# Patient Record
Sex: Female | Born: 1960 | Race: Black or African American | Hispanic: No | Marital: Single | Smoking: Never smoker
Health system: Southern US, Community
[De-identification: ages and names within clinical notes are randomized; demographics above are authoritative.]

## PROBLEM LIST (undated history)

## (undated) DIAGNOSIS — M810 Age-related osteoporosis without current pathological fracture: Secondary | ICD-10-CM

## (undated) DIAGNOSIS — N809 Endometriosis, unspecified: Secondary | ICD-10-CM

## (undated) DIAGNOSIS — D72819 Decreased white blood cell count, unspecified: Secondary | ICD-10-CM

## (undated) DIAGNOSIS — D649 Anemia, unspecified: Principal | ICD-10-CM

## (undated) HISTORY — PX: ABDOMINAL SURGERY: SHX537

## (undated) HISTORY — DX: Anemia, unspecified: D64.9

## (undated) HISTORY — DX: Decreased white blood cell count, unspecified: D72.819

---

## 1999-01-20 ENCOUNTER — Encounter: Admission: RE | Admit: 1999-01-20 | Discharge: 1999-01-20 | Payer: Self-pay | Admitting: General Practice

## 1999-01-20 ENCOUNTER — Encounter: Payer: Self-pay | Admitting: General Practice

## 1999-07-17 ENCOUNTER — Other Ambulatory Visit: Admission: RE | Admit: 1999-07-17 | Discharge: 1999-07-17 | Payer: Self-pay | Admitting: Obstetrics and Gynecology

## 2002-01-26 ENCOUNTER — Ambulatory Visit (HOSPITAL_COMMUNITY): Admission: RE | Admit: 2002-01-26 | Discharge: 2002-01-26 | Payer: Self-pay | Admitting: Internal Medicine

## 2002-01-26 ENCOUNTER — Encounter: Payer: Self-pay | Admitting: Internal Medicine

## 2003-01-30 ENCOUNTER — Ambulatory Visit (HOSPITAL_COMMUNITY): Admission: RE | Admit: 2003-01-30 | Discharge: 2003-01-30 | Payer: Self-pay | Admitting: Internal Medicine

## 2005-10-13 ENCOUNTER — Other Ambulatory Visit: Admission: RE | Admit: 2005-10-13 | Discharge: 2005-10-13 | Payer: Self-pay | Admitting: Obstetrics and Gynecology

## 2005-10-20 ENCOUNTER — Encounter: Admission: RE | Admit: 2005-10-20 | Discharge: 2005-10-20 | Payer: Self-pay | Admitting: Obstetrics and Gynecology

## 2005-12-26 ENCOUNTER — Ambulatory Visit: Payer: Self-pay | Admitting: Hematology & Oncology

## 2006-01-01 LAB — CBC WITH DIFFERENTIAL/PLATELET
BASO%: 0.4 % (ref 0.0–2.0)
EOS%: 1.5 % (ref 0.0–7.0)
MCHC: 34.2 g/dL (ref 32.0–36.0)
MONO#: 0.2 10*3/uL (ref 0.1–0.9)
RBC: 3.87 10*6/uL (ref 3.70–5.32)
RDW: 13.4 % (ref 11.3–14.5)
WBC: 2.4 10*3/uL — ABNORMAL LOW (ref 3.9–10.0)
lymph#: 0.9 10*3/uL (ref 0.9–3.3)

## 2006-01-01 LAB — CHCC SMEAR

## 2006-01-06 LAB — PROTEIN ELECTROPHORESIS, SERUM
Albumin ELP: 59.3 % (ref 55.8–66.1)
Total Protein, Serum Electrophoresis: 8.2 g/dL (ref 6.0–8.3)

## 2006-01-06 LAB — RHEUMATOID FACTOR: Rheumatoid fact SerPl-aCnc: <20 IU/mL (ref 0–20)

## 2006-01-06 LAB — VITAMIN B12: Vitamin B-12: 658 pg/mL (ref 211–911)

## 2006-01-06 LAB — FERRITIN: Ferritin: 111 ng/mL (ref 10–291)

## 2006-02-22 ENCOUNTER — Ambulatory Visit: Payer: Self-pay | Admitting: Hematology & Oncology

## 2006-04-19 ENCOUNTER — Ambulatory Visit: Payer: Self-pay | Admitting: Hematology & Oncology

## 2006-11-10 ENCOUNTER — Encounter: Admission: RE | Admit: 2006-11-10 | Discharge: 2006-11-10 | Payer: Self-pay | Admitting: Obstetrics and Gynecology

## 2007-11-14 ENCOUNTER — Encounter: Admission: RE | Admit: 2007-11-14 | Discharge: 2007-11-14 | Payer: Self-pay | Admitting: Obstetrics and Gynecology

## 2010-12-05 ENCOUNTER — Other Ambulatory Visit (HOSPITAL_COMMUNITY): Payer: Self-pay | Admitting: Obstetrics and Gynecology

## 2010-12-05 DIAGNOSIS — Z1231 Encounter for screening mammogram for malignant neoplasm of breast: Secondary | ICD-10-CM

## 2010-12-16 ENCOUNTER — Ambulatory Visit (HOSPITAL_COMMUNITY)
Admission: RE | Admit: 2010-12-16 | Discharge: 2010-12-16 | Disposition: A | Discharge status: Home / Self Care | Payer: 59 | Source: Ambulatory Visit | Attending: Obstetrics and Gynecology | Admitting: Obstetrics and Gynecology

## 2010-12-16 DIAGNOSIS — Z1231 Encounter for screening mammogram for malignant neoplasm of breast: Secondary | ICD-10-CM | POA: Insufficient documentation

## 2011-08-11 ENCOUNTER — Emergency Department (HOSPITAL_COMMUNITY)
Admission: EM | Admit: 2011-08-11 | Discharge: 2011-08-12 | Disposition: A | Discharge status: Home / Self Care | Payer: No Typology Code available for payment source | Attending: Emergency Medicine | Admitting: Emergency Medicine

## 2011-08-11 ENCOUNTER — Encounter (HOSPITAL_COMMUNITY): Payer: Self-pay | Admitting: Emergency Medicine

## 2011-08-11 ENCOUNTER — Emergency Department (HOSPITAL_COMMUNITY): Payer: No Typology Code available for payment source

## 2011-08-11 DIAGNOSIS — S20219A Contusion of unspecified front wall of thorax, initial encounter: Secondary | ICD-10-CM | POA: Insufficient documentation

## 2011-08-11 DIAGNOSIS — R079 Chest pain, unspecified: Secondary | ICD-10-CM | POA: Insufficient documentation
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
## 2011-08-12 ENCOUNTER — Emergency Department (HOSPITAL_COMMUNITY): Payer: No Typology Code available for payment source

## 2011-08-12 MED ORDER — CYCLOBENZAPRINE HCL 10 MG PO TABS: 10.0000 mg | ORAL_TABLET | Freq: Two times a day (BID) | ORAL | Status: DC | PRN

## 2011-08-12 MED ORDER — IBUPROFEN 800 MG PO TABS
800.0000 mg | ORAL_TABLET | Freq: Once | ORAL | Status: AC
  Administered 2011-08-12: 800 mg via ORAL
  Filled 2011-08-12: qty 1

## 2011-08-12 MED ORDER — IBUPROFEN 800 MG PO TABS: 800.0000 mg | ORAL_TABLET | Freq: Three times a day (TID) | ORAL | Status: AC

## 2011-08-12 NOTE — ED Notes (Signed)
Patient transported to X-ray 
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
## 2011-08-21 ENCOUNTER — Emergency Department (INDEPENDENT_AMBULATORY_CARE_PROVIDER_SITE_OTHER)
Admission: EM | Admit: 2011-08-21 | Discharge: 2011-08-21 | Disposition: A | Discharge status: Home Health | Payer: No Typology Code available for payment source | Source: Home / Self Care | Attending: Emergency Medicine | Admitting: Emergency Medicine

## 2011-08-21 ENCOUNTER — Encounter (HOSPITAL_COMMUNITY): Payer: Self-pay

## 2011-08-21 DIAGNOSIS — S20212S Contusion of left front wall of thorax, sequela: Secondary | ICD-10-CM

## 2011-08-21 HISTORY — DX: Endometriosis, unspecified: N80.9

## 2011-08-21 MED ORDER — CYCLOBENZAPRINE HCL 10 MG PO TABS: 10.0000 mg | ORAL_TABLET | Freq: Two times a day (BID) | ORAL | Status: AC | PRN
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
## 2011-08-21 NOTE — Discharge Instructions (Signed)
Take the medication as written. Take 1 gram of tylenol with the motrin up to 4 times a day as needed for pain and fever. This with the meloxicam is an effective combination for pain. Do not exceed 4 grams of tylenol a day from all sources. Return if you get worse, have a  fever >100.4, or for any concerns.   Go to www.goodrx.com to look up your medications. This will give you a list of where you can find your prescriptions at the most affordable prices.   Go to www.goodrx.com to look up your medications. This will give you a list of where you can find your prescriptions at the most affordable prices.   Call [facility]  [phone]  If you have no primary doctor, here are some resources that may be helpful:  Medicaid-accepting [facility] Providers:   - [facility]- [address]      [phone]      Mon-Fri 9am-7pm, Sat 9am-1pm   - [facility]- [address] [place]      [phone]   - [facility]- [address], [address] [place]      [phone]   [facility]- [address]      [phone]   - Renaye Rakers- [address] [place], [address]      [phone]      Only accepts [place] patients       after they have her name applied to their card   Self Pay (no insurance) in [place]:   - Sickle Cell Patients: Dr Willey Blade, [facility]      [address] [place]      [phone]   - [phone]   - Physician Referral Service- [phone]   - [facility]- [address] [place]      [phone]   [facility] [place]- [address]   - [facility]- see information above      (Speak to [employer] if you do not have insurance)   - [facility]- [address] [place]      [phone]   - [facility] [place]- [address]      [phone]   - [facility]- [address]      [phone]   - Dr Julio Sicks-  [address], [place]      [phone]   - [facility]- [address]      [phone]   - [facility]- [address]      [phone]      Also [address]      [phone]   - [facility]- [address]      [phone]      1st and 3rd Saturday every month, 10am-1pm    Other agencies that provide inexpensive medical care:     [facility]  [phone]    [facility]  [phone]    [facility]  [phone]    [facility]  [phone] [address] [place] [address]    [facility]  [phone]    [facility]  [phone] [facility] [phone]   [address]  [place], [place] [address]  Chronic Pain Problems Contact [facility]  [phone] Patients need to be referred by their primary care doctor.  [facility]  [facility]     [employer]                          [facility] Dept. [address]                        [address]      [address] [place] [address]   [phone] (After Hours)  General Information: Finding a doctor when you do not have health insurance can be tricky. Although you are not limited by an insurance plan, you are of course limited by her finances and how much but he can pay out of pocket.  What are your options if you don't have health insurance?   1) Find a Librarian, academic and Pay Out of Pocket Although you won't have to find out who is covered by your insurance plan, it is a good idea to ask around and get recommendations. You will then need to call the office and see if the doctor you have chosen will accept you as a new patient and what types of options they offer for patients who are self-pay. Some doctors offer discounts or will set up payment plans for their patients who do not  have insurance, but you will need to ask so you aren't surprised when you get to your appointment.  2) Contact Your Local Health Department Not all health departments have doctors that can see patients for sick visits, but many do, so it is worth a call to see if yours does. If you don't know where your local health department is, you can check in your phone book. The CDC also has a tool to help you locate your state's health department, and many state websites also have listings of all of their local health departments.  3) Find a Walk-in Clinic If your illness is not likely to be very severe or complicated, you may want to try a walk in clinic. These are popping up all over the country in pharmacies, drugstores, and shopping centers. They're usually staffed by nurse practitioners or physician assistants that have been trained to treat common illnesses and complaints. They're usually fairly quick and inexpensive. However, if you have serious medical issues or chronic medical problems, these are probably not your best option

## 2011-11-10 ENCOUNTER — Other Ambulatory Visit (HOSPITAL_COMMUNITY): Payer: Self-pay | Admitting: Family Medicine

## 2011-11-10 DIAGNOSIS — Z1231 Encounter for screening mammogram for malignant neoplasm of breast: Secondary | ICD-10-CM

## 2011-12-23 ENCOUNTER — Ambulatory Visit: Payer: Self-pay | Admitting: Obstetrics and Gynecology

## 2011-12-24 ENCOUNTER — Ambulatory Visit (HOSPITAL_COMMUNITY)
Admission: RE | Admit: 2011-12-24 | Discharge: 2011-12-24 | Disposition: A | Discharge status: Home / Self Care | Payer: No Typology Code available for payment source | Source: Ambulatory Visit | Attending: Family Medicine | Admitting: Family Medicine

## 2011-12-24 DIAGNOSIS — Z1231 Encounter for screening mammogram for malignant neoplasm of breast: Secondary | ICD-10-CM

## 2011-12-30 ENCOUNTER — Other Ambulatory Visit: Payer: Self-pay | Admitting: Family Medicine

## 2011-12-30 DIAGNOSIS — N63 Unspecified lump in unspecified breast: Secondary | ICD-10-CM

## 2012-01-06 ENCOUNTER — Ambulatory Visit
Admission: RE | Admit: 2012-01-06 | Discharge: 2012-01-06 | Disposition: A | Discharge status: Home / Self Care | Payer: 59 | Source: Ambulatory Visit | Attending: Family Medicine | Admitting: Family Medicine

## 2012-01-06 DIAGNOSIS — N63 Unspecified lump in unspecified breast: Secondary | ICD-10-CM

## 2012-04-27 ENCOUNTER — Telehealth: Payer: Self-pay | Admitting: Obstetrics and Gynecology

## 2012-04-27 ENCOUNTER — Encounter (HOSPITAL_COMMUNITY): Payer: Self-pay | Admitting: *Deleted

## 2012-04-27 ENCOUNTER — Emergency Department (HOSPITAL_COMMUNITY)
Admission: EM | Admit: 2012-04-27 | Discharge: 2012-04-27 | Disposition: A | Discharge status: Home / Self Care | Payer: 59 | Source: Home / Self Care | Attending: Family Medicine | Admitting: Family Medicine

## 2012-04-27 DIAGNOSIS — D72819 Decreased white blood cell count, unspecified: Secondary | ICD-10-CM

## 2012-04-27 DIAGNOSIS — R319 Hematuria, unspecified: Secondary | ICD-10-CM

## 2012-04-27 LAB — COMPREHENSIVE METABOLIC PANEL
Albumin: 4.1 g/dL (ref 3.5–5.2)
Alkaline Phosphatase: 51 U/L (ref 39–117)
BUN: 16 mg/dL (ref 6–23)
Calcium: 9.9 mg/dL (ref 8.4–10.5)
Creatinine, Ser: 0.77 mg/dL (ref 0.50–1.10)
GFR calc Af Amer: >90 mL/min (ref >90)
Glucose, Bld: 107 mg/dL — ABNORMAL HIGH (ref 70–99)
Total Protein: 7.6 g/dL (ref 6.0–8.3)

## 2012-04-27 LAB — POCT URINALYSIS DIP (DEVICE)
Glucose, UA: 100 mg/dL — AB
Nitrite: NEGATIVE
Specific Gravity, Urine: 1.025 (ref 1.005–1.030)
Urobilinogen, UA: 2 mg/dL — ABNORMAL HIGH (ref 0.0–1.0)

## 2012-04-27 LAB — CBC
MCHC: 33.6 g/dL (ref 30.0–36.0)
RBC: 3.82 MIL/uL — ABNORMAL LOW (ref 3.87–5.11)
RDW: 13.4 % (ref 11.5–15.5)
WBC: 2.7 10*3/uL — ABNORMAL LOW (ref 4.0–10.5)

## 2012-04-27 LAB — POCT I-STAT, CHEM 8
Calcium, Ion: 1.28 mmol/L — ABNORMAL HIGH (ref 1.12–1.23)
Creatinine, Ser: 1 mg/dL (ref 0.50–1.10)
Glucose, Bld: 106 mg/dL — ABNORMAL HIGH (ref 70–99)
HCT: 36 % (ref 36.0–46.0)
Hemoglobin: 12.2 g/dL (ref 12.0–15.0)
Potassium: 4.1 mEq/L (ref 3.5–5.1)

## 2012-04-27 MED ORDER — CEPHALEXIN 500 MG PO CAPS: 500.0000 mg | ORAL_CAPSULE | Freq: Two times a day (BID) | ORAL | Status: DC

## 2012-04-27 NOTE — ED Notes (Signed)
Cal back number for lab issues verified; Rx compliance reviewed
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
## 2012-04-27 NOTE — ED Notes (Signed)
Hematuria - onset this AM - at first had some very slight cramping but now not other symptoms

## 2012-04-27 NOTE — Telephone Encounter (Signed)
Tc from pt. Pt with "blood in urine" since this am. Pt has noticed blood only with urination. Pt having abd discomfort. Informed pt to contact PCP to sched eval to r/o UTI. Pt voices understanding. Told pt to cb if needed to sched office eval.

## 2012-04-28 LAB — URINE CULTURE
Colony Count: 30000
Special Requests: NORMAL

## 2012-07-03 ENCOUNTER — Other Ambulatory Visit: Payer: Self-pay | Admitting: Oncology

## 2012-11-01 ENCOUNTER — Telehealth: Payer: Self-pay | Admitting: Oncology

## 2012-11-01 NOTE — Telephone Encounter (Signed)
LVOM FOR PT TO RETURN CALL IN RE REFERRAL  °

## 2012-11-02 ENCOUNTER — Telehealth: Payer: Self-pay | Admitting: Oncology

## 2012-11-02 NOTE — Telephone Encounter (Signed)
S/W PT IN RE TO NP APPT 09/10 @ 1:30 W/DR. GRANFORTUNA WELCOME PACKET MAILED. DX- NEUTROPENIA  WELCOME PACKET MAILED.

## 2012-11-08 ENCOUNTER — Telehealth: Payer: Self-pay | Admitting: Oncology

## 2012-11-08 NOTE — Telephone Encounter (Signed)
C/D 11/08/12 for appt. 11/30/12 °

## 2012-11-12 ENCOUNTER — Other Ambulatory Visit: Payer: Self-pay | Admitting: Oncology

## 2012-11-12 ENCOUNTER — Encounter: Payer: Self-pay | Admitting: Oncology

## 2012-11-12 DIAGNOSIS — D649 Anemia, unspecified: Secondary | ICD-10-CM

## 2012-11-12 DIAGNOSIS — D72819 Decreased white blood cell count, unspecified: Secondary | ICD-10-CM

## 2012-11-12 HISTORY — DX: Decreased white blood cell count, unspecified: D72.819

## 2012-11-12 HISTORY — DX: Anemia, unspecified: D64.9

## 2012-11-13 ENCOUNTER — Telehealth: Payer: Self-pay | Admitting: Oncology

## 2012-11-13 NOTE — Telephone Encounter (Signed)
Message to Tiffany re new pt lbs per 8/22 pof.

## 2012-11-30 ENCOUNTER — Encounter: Payer: 59 | Admitting: Oncology

## 2012-11-30 ENCOUNTER — Ambulatory Visit: Payer: 59

## 2012-12-06 ENCOUNTER — Other Ambulatory Visit (HOSPITAL_COMMUNITY): Payer: Self-pay | Admitting: Family Medicine

## 2012-12-06 DIAGNOSIS — Z1231 Encounter for screening mammogram for malignant neoplasm of breast: Secondary | ICD-10-CM

## 2013-01-10 ENCOUNTER — Ambulatory Visit (HOSPITAL_COMMUNITY): Payer: 59

## 2013-01-24 ENCOUNTER — Ambulatory Visit (HOSPITAL_COMMUNITY)
Admission: RE | Admit: 2013-01-24 | Discharge: 2013-01-24 | Disposition: A | Discharge status: Home / Self Care | Payer: 59 | Source: Ambulatory Visit | Attending: Family Medicine | Admitting: Family Medicine

## 2013-01-24 DIAGNOSIS — Z1231 Encounter for screening mammogram for malignant neoplasm of breast: Secondary | ICD-10-CM | POA: Insufficient documentation

## 2013-11-13 ENCOUNTER — Other Ambulatory Visit: Payer: Self-pay | Admitting: Obstetrics and Gynecology

## 2013-11-13 ENCOUNTER — Ambulatory Visit
Admission: RE | Admit: 2013-11-13 | Discharge: 2013-11-13 | Disposition: A | Discharge status: Home / Self Care | Payer: 59 | Source: Ambulatory Visit | Attending: Obstetrics and Gynecology | Admitting: Obstetrics and Gynecology

## 2013-11-13 DIAGNOSIS — M810 Age-related osteoporosis without current pathological fracture: Secondary | ICD-10-CM

## 2013-12-26 ENCOUNTER — Other Ambulatory Visit (HOSPITAL_COMMUNITY): Payer: Self-pay | Admitting: Family Medicine

## 2013-12-26 DIAGNOSIS — Z1231 Encounter for screening mammogram for malignant neoplasm of breast: Secondary | ICD-10-CM

## 2014-01-25 ENCOUNTER — Ambulatory Visit (HOSPITAL_COMMUNITY)
Admission: RE | Admit: 2014-01-25 | Discharge: 2014-01-25 | Disposition: A | Discharge status: Home / Self Care | Payer: 59 | Source: Ambulatory Visit | Attending: Family Medicine | Admitting: Family Medicine

## 2014-01-25 DIAGNOSIS — Z1231 Encounter for screening mammogram for malignant neoplasm of breast: Secondary | ICD-10-CM | POA: Diagnosis not present

## 2015-01-07 ENCOUNTER — Ambulatory Visit: Payer: 59 | Attending: Obstetrics and Gynecology | Admitting: Rehabilitation

## 2015-01-07 ENCOUNTER — Encounter: Payer: Self-pay | Admitting: Rehabilitation

## 2015-01-07 DIAGNOSIS — M810 Age-related osteoporosis without current pathological fracture: Secondary | ICD-10-CM | POA: Diagnosis not present
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
## 2015-11-14 ENCOUNTER — Other Ambulatory Visit: Payer: 59 | Admitting: Sports Medicine

## 2015-11-20 ENCOUNTER — Ambulatory Visit (INDEPENDENT_AMBULATORY_CARE_PROVIDER_SITE_OTHER): Payer: 59 | Admitting: Sports Medicine

## 2015-11-20 ENCOUNTER — Encounter: Payer: Self-pay | Admitting: Sports Medicine

## 2015-11-20 VITALS — BP 110/76 | Ht 71.0 in | Wt 143.0 lb

## 2015-11-20 DIAGNOSIS — M79661 Pain in right lower leg: Secondary | ICD-10-CM

## 2015-11-20 NOTE — Progress Notes (Signed)
   Subjective:    Patient ID: Shari Perez, female    DOB: [birth year], 55 y.o.   MRN: [id number]  HPI chief complaint: Right lower leg pain  Very pleasant 55 year old female comes in at the request of Dr. Thurston HoleWainer for evaluation of chronic right lower leg pain. Patient suffered a right tibial stress fracture per her report about a year and a half ago. She was treated with Cam Walker immobilization initially and then proceeded with Aircast immobilization afterwards. Total time of immobilization was about 6 months. She had to stop exercising as a result of this injury. 3 weeks ago she decided to start a new walking program and had an immediate onset of returning pain. She localizes the pain to the mid tibia. It is diffuse up-and-down the tibia and not focal. She has not noticed any swelling. She has had a number of inserts for her walking shoes in the most comfortable insert that she has is a neutral cushioned insert. She denies numbness or tingling. Recent x-rays at Dr. Sherene SiresWainer's office were unremarkable.  Past medical history reviewed Medications reviewed Allergies reviewed    Review of Systems    as above Objective:   Physical Exam  Well-developed, well-nourished. No acute distress. Vital signs reviewed  Right lower leg: Patient is diffusely tender to palpation along the mid to lower tibia. Tenderness along the medial tibial border but also pain with percussion over the mid tibia. No soft tissue swelling. Neurovascularly intact distally.  Gait analysis shows the patient's gait to be fairly neutral. No noticeable limp.  MSK ultrasound of the right lower leg was performed. Limited images were obtained. There is a hypoechoic fluid level along the medial tibial border. No definite cortical defect is seen.      Assessment & Plan:   Chronic right lower leg pain-rule out tibial stress fracture  MRI of the right lower leg specifically to rule out a tibial stress fracture versus medial  tibial stress syndrome. Phone follow-up after I reviewed that study to discuss the results and delineate further treatment.

## 2015-11-27 ENCOUNTER — Ambulatory Visit: Payer: 59 | Attending: Obstetrics and Gynecology

## 2015-11-27 DIAGNOSIS — M810 Age-related osteoporosis without current pathological fracture: Secondary | ICD-10-CM | POA: Insufficient documentation

## 2015-11-27 DIAGNOSIS — M6281 Muscle weakness (generalized): Secondary | ICD-10-CM | POA: Diagnosis present

## 2015-11-27 DIAGNOSIS — R293 Abnormal posture: Secondary | ICD-10-CM | POA: Diagnosis not present
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
## 2015-11-27 NOTE — Patient Instructions (Addendum)
        Resisted Horizontal Abduction: Bilateral   Sit or stand, tubing in both hands, arms out in front. Keeping arms straight, pinch shoulder blades together and stretch arms out. Repeat _10___ times per set. Do 2____ sets per session. Do _many___ sessions per day.    Scapular Retraction: Elbow Flexion- seated at your desk   With elbows bent to 90, pinch shoulder blades together and rotate arms out, keeping elbows bent. Repeat _10___ times per set. Do _1___ sets per session. Do many____ sessions per day.    Strengthening: Resisted Extension- both arms at the same time   Hold tubing in right hand, arm forward. Pull arm back, elbow straight. Repeat _10___ times per set. Do _2___ sets per session. Do _1-2___ sessions per day.  Can place band around the front of your body and perform with both arms at the same time.     ABDUCTION: Standing (Active)   Stand, feet flat. Lift right leg out to side. Use _0__ lbs. Complete _2x_10_ repetitions. Perform __2_ sessions per day.     EXTENSION: Standing (Active)  Stand, both feet flat. Draw right leg behind body as far as possible. Use 0___ lbs. Complete 2x 10 repetitions. Perform __2_ sessions per day.  Copyright  VHI. All rights reserved.    Posture - Standing   Good posture is important. Avoid slouching and forward head thrust. Maintain curve in low back and align ears over shoulders, hips over ankles.  Pull your belly button in toward your back bone. Posture Tips DO: - stand tall and erect - keep chin tucked in - keep head and shoulders in alignment - check posture regularly in mirror or large window - pull head back against headrest in car seat;  Change your position often.  Sit with lumbar support. DON'T: - slouch or slump while watching TV or reading - sit, stand or lie in one position  for too long;  Sitting is especially hard on the spine so if you sit at a desk/use the computer, then stand up often! Copyright   VHI. All rights reserved.  Posture - Sitting  Sit upright, head facing forward. Try using a roll to support lower back. Keep shoulders relaxed, and avoid rounded back. Keep hips level with knees. Avoid crossing legs for long periods. Copyright  VHI. All rights reserved.  Chronic neck strain can develop because of poor posture and faulty work habits  Postural strain related to slumped sitting and forward head posture is a leading cause of headaches, neck and upper back pain  General strengthening and flexibility exercises are helpful in the treatment of neck pain.  Most importantly, you should learn to correct the posture that may be contributing to chronic pain.   Change positions frequently  Change your work or home environment to improve posture and mechanics.    [facility] [address], [address] [place], [place] [address] Phone # [phone] Fax [phone]

## 2015-12-02 ENCOUNTER — Ambulatory Visit
Admission: RE | Admit: 2015-12-02 | Discharge: 2015-12-02 | Disposition: A | Discharge status: Home / Self Care | Payer: 59 | Source: Ambulatory Visit | Attending: Sports Medicine | Admitting: Sports Medicine

## 2015-12-02 DIAGNOSIS — M79661 Pain in right lower leg: Secondary | ICD-10-CM

## 2015-12-03 ENCOUNTER — Telehealth: Payer: Self-pay | Admitting: Sports Medicine

## 2015-12-03 NOTE — Telephone Encounter (Signed)
I spoke with patient on the phone today after reviewing the MRI of her right lower leg. It is unremarkable. No evidence of stress fracture nor any evidence of medial tibial stress syndrome. Therefore, I think her pain is biomechanical in nature. I recommended that she return to the office with her walking shoes so that we can do a thorough gait analysis in anticipation of possibly providing her with some home exercises or trying some inserts in her walking shoes. I certainly think she is okay to continue with activity as tolerated given her unremarkable MRI.

## 2015-12-04 ENCOUNTER — Ambulatory Visit: Payer: 59 | Admitting: Physical Therapy

## 2015-12-04 ENCOUNTER — Encounter: Payer: Self-pay | Admitting: Physical Therapy

## 2015-12-04 DIAGNOSIS — R293 Abnormal posture: Secondary | ICD-10-CM

## 2015-12-04 DIAGNOSIS — M6281 Muscle weakness (generalized): Secondary | ICD-10-CM

## 2015-12-04 DIAGNOSIS — M810 Age-related osteoporosis without current pathological fracture: Secondary | ICD-10-CM

## 2015-12-04 NOTE — Patient Instructions (Addendum)
WALKING Tips  Walking is the single best weight bearing exercise for most people most of the time. All that is needed is a good pair of walking shoes and a safe place to walk. GUIDELINES: 1.Shoes should be supportive and large enough to easily accommodate your feet in both length and width. 2.To get more out of your walking, practice walking exercises. 3.Establish a regular walking habit. 4.Maintain good upper body alignment when walking.  CONSIDER the use of a heart rate monitor and/or pedometer to add purpose, goals, and variability to your walking workout.  Walk backwards, walk then fast by changing speed, march, Frankenstein walk, walk on toes and on heels, Walk in a line, step up on curb then step down.  Copyright  VHI. All rights reserved.      Osteoporosis   What is Osteoporosis?  - A silent disease in which the skeleton is weakened by decreased bone density. - Characterized by low bone mass, deterioration of bone, and increased risk of fracture postmenopausal (primary) or the result of an identifiable condition/event (secondary) - Commonly found in the wrists, spine, and hips; these are high-risk stress areas and very susceptible to fractures.  The Facts: - There are 1.5 million fractures/year o 500,000 spine; 250,000 hip with over 60,000 nursing home admissions secondary to hip fracture; and 200,000 wrist - After hip fracture, only 50% of people able to walk independently prior to the fracture return to independent ambulation. - Bone mass: Peaks at age 105-30, and begins declining at age 54-50.   Osteoporosis is defined by the World Health Organization [facility]) as:  NOF/WHO Criteria for Interpreting Results of Bone Density Assessment  Results Diagnosis  Within 1 standard deviation (SD) of young adult mean Normal  Between 1 and -2.5 SD below mean, repeat in 2 years Low bone mass (osteopenia)  Greater than -2.5 SD below mean Osteoporosis  Greater than -2.5 SD below mean and one  or more fragility fractures exist Severe Osteoporosis  *Results can be affected by positioning of the body in the DEXA scan, presence of current or old fractures, arthritis, extraneous calcifications.    Osteoporosis is not just a women's disease!  - 30-40% of women will develop osteoporosis - 5-15% of males will develop osteoporosis   What are the risk factors?  1. Female 2. Thin, small frame 3. Caucasian, Asian race 4. Early menopause (<57 years old)/amenorrhea/delayed puberty 5. Old age 50. Family history (fractures, stooped posture)\ 7. Low calcium diet 8. Sedentary lifestyle 9. Alcohol, Caffeine, Smoking 10. Malnutrition, GI Disease 11. Prolonged use of Glucocorticoids (Prednisone), Meds to treat asthma, arthritis, cancers, thyroid, and anti-seizure meds.  How do I know for sure?  Get a BONE DENSITY TEST!  This measures bone loss and it's painless, non-invasive, and only takes 5-10 minutes!  What can I do about it?  ? Decrease your risk factors (alcohol, caffeine, smoking) ? Helpful medications (see next page) ? Adequate Calcium and Vitamin D intake ? Get active! o Proper posture - Sit and stand tall! No slouching or twisting o Weight-Bearing Exercise - walking, stair climbing, elliptical; NO jogging or high-impact exercise. o Resistive Exercise - Cybex weight equipment, Nautilus, dumbbells, therabands  **Be sure to maintain proper alignment when lifting any weight!!  **When using equipment, avoid abdominal exercises which involve "crunching" or curling or twisting the trunk, biceps machines, cross-country machines, moving handlebars, or ANY MACHINE WITH ROTATION OR FORWARD BENDING!!!           Approved Pharmacologic Management of  Osteoporosis  Agent Approved for prevention Approved for treatment BMD increased spine/hip Fracture reduction  Estrogen/Hormone Therapy (Estrace, Estratab, Ogen, Premarin, Vivell, Prempro, Femhert, Orthoest) Yes Yes 3-6% 35% spine  and hip  Bisphosphonates  (Fosamax, Actonel, Boniva) Yes Yes 3-8% 35-50% spine and non-spine  Calcitonin (Miacalcin, Calcimar, Fortical) No Yes 0-3% None stated  Raloxifene (Evista) Yes Yes 2-3% 30-55%  Parathyroid Hormone (Forteo) No Yes, only in those at high risk for fracture None stated 53-65%     Recommended Daily Calcium Intakes   Population Group NIH/NOF* (mg elemental calcium)  Children 1-10 years [phone]  Children 11-24 years 1200-1500  Men and Women 25-64 years At least 1200  Pregnant/Lactating At least 1200  Postmenopausal women with hormone replacement therapy At least 1200  Postmenopausal women without   hormone replacement therapy At least 1200  Men and women 65 + At least 1200  *In 1987, 1990, 1994, and 2000, the NIH held consensus conferences on osteoporosis and calcium.  This column shows the most recent recommendations regarding calcium intak for preventing and managing osteoporosis.          Calcium Content of Selected Foods  Dairy Foods Calcium Content (mg) Non-Dairy Foods Calcium Content (mg)  Buttermilk, 1 cup 300 Calcium-fortified juice, 1 cup 300  Milk, 1 cup 300 Salmon, canned with Bones, 2 oz 100  Lactaid milk, 1 cup 300-500 Oysters, raw 13-19 medium 226  Soy milk, 1 cup 200-300 Sardines, canned with bones, 3 oz 372  Yogurt (plain, lowfat) 1 cup 250-300 Shrimp, canned 3 oz 98  Frozen yogurt (fruit) 1 cup 200-600 Collard greens, cooked 1 cup 357  Cheddar, mozzarella, or [place] cheese, 1oz 205 Broccoli, cooked 1 cup 78  Cottage cheese (lowfat) 4 oz 200 Soybeans, cooked 1 cup 131  Part-skim ricotta cheese, 4oz 335 Tofu, 4oz* *  Vanilla ice cream, 1 cup 120-300    *Calcium content of tofu varies depending on processing method; check nutritional label on package for precise calcium content.     Suggested Guidelines for Calcium Supplement Use:  ? Calcium is absorbed most efficiently if taken in small amounts throughout the day.   Always divide the daily dose into smaller amounts if the total daily dose is 500mg  or more per day.  The body cannot use more than 500mg  Calcium at any one time. ? The use of manufactured supplements is encouraged.  Calcium as bone meal or dolomite may contain lead or other heavy metals as contaminants. ? Calcium supplements should not be taken with high fiber meals or with bulk forming laxatives. ? If calcium carbonate is used as the supplement form, it should be taken with meals to assure that stomach acid production is present to facilitate optimal dissolution and absorption of calcium.  This is important if atrophic gastritis with hypo- or achlorhydria is present, which it is in 20-50% of older individuals. ? It is important to drink plenty of fluids while using the supplement to help reduce problems with side effects like constipation or bloating.  If these symptoms become a problem, switching to another form of supplement may be the answer. ? Another alternative is calcium-fortified foods, including fruit juices, cereals, and breads.  These foods are now marketed with added calcium and may be less likely to cause side effects. ? Those with personal or family histories of kidney stones should be monitored to assure that hypercalcuria does not occur. CALCIUM INTAKE QUIZ  Dairy products are the primary source of calcium for most people.  For a quick estimate of your daily calcium intake, complete the following steps:  1. Use the chart below to determine your daily intake of calcium from diary foods. Servings of dairy per day 1 2 3 4 5 6 7 8   Milligrams (mg) of calcium: 250 [phone] 1250 1500 1750 2000   2.  Enter your total daily calcium intake from dairy foods:     _____mg  3.  Add 350 mg, which is the average for all other dietary sources:                 +            350 mg  4.  The sum of your total daily calcium intake:               ______mg  5.  Enter the recommended calcium  intake for your age from the chart below;         ______mg  6.  Enter your daily intake from step 4 above and subtract:                             -        _______mg  7.  The result is how much additional calcium you need:                                          ______mg      Recommended Daily Calcium Intake  Population Calcium (mg)  Children 1-10 years [phone]  Children 11-24 years 1200-1500  Men and women 25-64 At least 1200  Pregnant/Lactating At least 1200  Postmenopausal women with hormone replacement therapy At least 1200  Postmenopausal women without hormone replacement therapy At least 1200  Men and women 65+ At least 1200       SAFETY TIPS FOR FALL PREVENTION   1. Remove throw rugs and make certain carpet edges are securely fastened to the floor.  2. Reduce clutter, especially in traffic areas of the home.   3. Install/maintain sturdy handrails at stairs.  4. Increase wattage of lighting in hallways, bathrooms, kitchens, stairwells, and entrances to home.  5. Use night-lights near bed, in hallways, and in bathroom to improve night safety.  6. Install safety handrails in shower, tub, and around toilet.  Bathtubs and shower stalls should have non-skid surfaces.  7. When you must reach for something high, use a safety step stool, one with wide steps and a friction surface to stand on.  A type equipped with a high handrail is preferred.  8. If a cane or other walking aid has been recommended, use it to help increase your stability.  9. Wear supportive, cushioned, low-heeled shoes.  Avoid "scuffs" (backless bedroom slippers) and high heels.  10. Avoid rushing to answer a phone, doorbell, or anything else!  A portable phone that you can take from room to room with you is a good idea for security and safety.  11. Exercise regularly and stay active!!    Resources  National Osteoporosis [employer] www.NOF.org   Exercise for Osteoporosis; A Safe and  Effective Way to Build Bone Density and Muscle Strength By: Myra Gianottiianne Daniels, M.A.
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
## 2015-12-11 ENCOUNTER — Ambulatory Visit: Payer: 59 | Admitting: Physical Therapy

## 2015-12-11 ENCOUNTER — Encounter: Payer: Self-pay | Admitting: Physical Therapy

## 2015-12-11 DIAGNOSIS — R293 Abnormal posture: Secondary | ICD-10-CM | POA: Diagnosis not present

## 2015-12-11 DIAGNOSIS — M810 Age-related osteoporosis without current pathological fracture: Secondary | ICD-10-CM

## 2015-12-11 DIAGNOSIS — M6281 Muscle weakness (generalized): Secondary | ICD-10-CM

## 2015-12-11 NOTE — Patient Instructions (Addendum)
Go fit weighted vest at [employer]  Wear when walking for exercise.  Wear around house.                                              DO's and DON'T's   Avoid and/or Minimize positions of forward bending ( flexion)  Side bending and rotation of the trunk  Especially when movements occur together   When your back aches:   Don't sit down   Lie down on your back with a small pillow under your head and one under your knees or as outlined by our therapist. Or, lie in the 90/90 position ( on the floor with your feet and legs on the sofa with knees and hips bent to 90 degrees)  Tying or putting on your shoes:   Don't bend over to tie your shoes or put on socks.  Instead, bring one foot up, cross it over the opposite knee and bend forward (hinge) at the hips to so the task.  Keep your back straight.  If you cannot do this safely, then you need to use long handled assistive devices such as a shoehorn and sock puller.  Exercising:  Don't engage in ballistic types of exercise routines such as high-impact aerobics or jumping rope  Don't do exercises in the gym that bring you forward (abdominal crunches, sit-ups, touching your  toes, knee-to-chest, straight leg raising.)  Follow a regular exercise program that includes a variety of different weight-bearing activities, such as low-impact aerobics, T' ai chi or walking as your physical therapist advises  Do exercises that emphasize return to normal body alignment and strengthening of the muscles that keep your back straight, as outlined in this program or by your therapist  Household tasks:  Don't reach unnecessarily or twist your trunk when mopping, sweeping, vacuuming, raking, making beds, weeding gardens, getting objects ou of cupboards, etc.  Keep your broom, mop, vacuum, or rake close to you and mover your whole body as you move them. Walk over to the area on which you are working. Arrange kitchen, bathroom, and bedroom shelves so that  frequently used items may be reached without excessive bending, twisting, and reaching.  Use a sturdy stool if necessary.  Don't bend from the waist to pick up something up  Off the floor, out of the trunk of your car, or to brush your teeth, wash your face, etc.   Bend at the knees, keeping back straight as possible. Use a reacher if necessary.   Prevention of fracture is the so-called "BOTTOm -Line" in the management of OSTEOPOROSIS. Do not take unnecessary chances in movement. Once a compression fracture occurs, the process is very difficult to control; one fracture is frequently followed by many more.      BODY MECHANICS Tips  Good body mechanics are important during activities of daily living. The practice of good body mechanics will: -help distribute weight throughout the skeleton in a more anatomically correct manner thus stimulating more normal forces on the bones, and encouraging stronger, healthier, denser bones. -reduce unnatural forces on bones, ligaments, joints and muscles and reduce risk of fracture, other injury or back pain. A WORD ON BODY POSITIONING: Sitting is the hardest position for the back. Lying on the back is the easiest. Standing, in good body alignment, is somewhere between. A good motto is: Sit less, stand more,  and, when you can't do that, lie down on your back and exercise to strengthen it.  Copyright  VHI. All rights reserved.    Computer Work    Biomedical scientist" on edge of chair or use a saddle-type seat. Keep natural arch in lower back. Adjust height of table and stool so that you can look straight ahead or slightly down at monitor. Use back support only as necessary. Consider use of computer glasses as needed.  Copyright  VHI. All rights reserved.  Coughing, Sneezing    Stabilize body by bending knees slightly and placing one hand on abdomen or in small of back to help stabilize your back. Alternatively, hold onto a kitchen counter, table or some sturdy  object to minimize sudden bending forces on the back.   Copyright  VHI. All rights reserved.  Eating    Sit, protecting natural arch in low back. Bring food to mouth, not mouth to food. Do not lean on elbows or arms.  Copyright  VHI. All rights reserved.  Getting Dressed    "Perch" on edge of chair to protect natural arch of lower back. Bring foot up to rest on other knee to put on socks and shoes and to tie shoes or use small stool to support foot. If you cannot do this activity safely, you need elastic shoelaces or Velcro closures and a long-handled shoehorn. Avoid bending over to tie shoes.  Copyright  VHI. All rights reserved.  Getting Dressed    "Perch" on edge of chair to protect natural arch of lower back. Bring foot up to rest on other knee to put on socks and shoes and to tie shoes or use small stool to support foot. If you cannot do this activity safely, you need elastic shoelaces or Velcro closures and a long-handled shoehorn. Avoid bending over to tie shoes.  Copyright  VHI. All rights reserved.  Getting Into and Out of Bed    Sit on edge of bed, feet on floor. Lie down sideways. Roll over onto back. Keep back straight. Use hand and elbow to lower and raise trunk. Move shoulders and hips at same rate to avoid twisting the back. To get out of bed, reverse movement.   Copyright  VHI. All rights reserved.  Going Down Steps    Stay close to rail. Back straight, chest up. Support on rail as necessary. Keep feet and knees straight. Support on upper leg as you descend to next step. Do not go down steps sideways.  Copyright  VHI. All rights reserved.  Golfer's Reach    When leaning forward to pick object up from floor, extend one leg out behind. Keep back straight. Hold onto a sturdy support with other hand. If feel unsteady then lean on something as you pick up a light object.   Copyright  VHI. All rights reserved.  Housework: Sweeping    DO: Keep  broom close to body. Keep back straight. Minimize bending and rotation of the back. Bend knees to avoid back strain. Sweep with short motions. Walk over to corners and hard-to-reach areas. DON'T: Reach with broom into corners and hard-to-reach areas.  Copyright  VHI. All rights reserved.  Pocketbooks and Shoulder Bags    Avoid heavy shoulder bags. If you use a shoulder bag, keep it light and alternate shoulders to carry it. Try placing the strap across body. Consider using a waist pack or smaller purse. Carry a waist pack in the back, except when it seems safer to have the  pack in front.   Copyright  VHI. All rights reserved.  Positioning on Back in Bed    Place pillow under knees. Use towel roll or pillow under neck and/or head if needed. Any pillow under head and/or neck should be as small as possible but enough to protect alignment of this area.  Copyright  VHI. All rights reserved.  Positioning on Side in Bed    Place pillow, long enough to support knees and feet, between legs. Pillow under head and/or neck to protect alignment of this area. Place pillow in front for support of upper arm. Consider use of a body pillow. Avoid "fetal" position.  Copyright  VHI. All rights reserved.  Standing Activities    During any prolonged standing activity (e.g., ironing, washing dishes, chopping vegetables), elevate one foot on a stool or on the shelf under the kitchen sink. Alternate feet.  Copyright  VHI. All rights reserved.  Reaching    DO: Use "Golfer's Reach" when getting item from refrigerator, washing machine, dryer, dishwasher, etc. DO: Kneel to get item from low area. DON'T: Bend your back to get item from low area.  Copyright  VHI. All rights reserved.  Pushing vs Pulling    Pushing is preferable to pulling. Keep back straight and use large leg muscles. Keep elbows either at sides or straight so leg and back muscles do the work . When pushing a wheelchair, stay  close to the chair.   Copyright  VHI. All rights reserved.  [facility] [address] [place], [place] [address] Phone # [phone] Fax [phone]
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
## 2016-05-26 DIAGNOSIS — Z0000 Encounter for general adult medical examination without abnormal findings: Secondary | ICD-10-CM | POA: Diagnosis not present

## 2016-05-26 DIAGNOSIS — Z13220 Encounter for screening for lipoid disorders: Secondary | ICD-10-CM | POA: Diagnosis not present

## 2016-06-01 ENCOUNTER — Other Ambulatory Visit: Payer: Self-pay | Admitting: Family Medicine

## 2016-06-01 DIAGNOSIS — E010 Iodine-deficiency related diffuse (endemic) goiter: Secondary | ICD-10-CM

## 2016-06-12 ENCOUNTER — Ambulatory Visit
Admission: RE | Admit: 2016-06-12 | Discharge: 2016-06-12 | Disposition: A | Discharge status: Home / Self Care | Payer: 59 | Source: Ambulatory Visit | Attending: Family Medicine | Admitting: Family Medicine

## 2016-06-12 DIAGNOSIS — E010 Iodine-deficiency related diffuse (endemic) goiter: Secondary | ICD-10-CM

## 2016-06-12 DIAGNOSIS — E042 Nontoxic multinodular goiter: Secondary | ICD-10-CM | POA: Diagnosis not present

## 2016-08-06 DIAGNOSIS — N644 Mastodynia: Secondary | ICD-10-CM | POA: Diagnosis not present

## 2016-08-06 DIAGNOSIS — E041 Nontoxic single thyroid nodule: Secondary | ICD-10-CM | POA: Diagnosis not present

## 2016-11-11 DIAGNOSIS — Z01411 Encounter for gynecological examination (general) (routine) with abnormal findings: Secondary | ICD-10-CM | POA: Diagnosis not present

## 2016-11-11 DIAGNOSIS — M810 Age-related osteoporosis without current pathological fracture: Secondary | ICD-10-CM | POA: Diagnosis not present

## 2016-11-11 DIAGNOSIS — Z1231 Encounter for screening mammogram for malignant neoplasm of breast: Secondary | ICD-10-CM | POA: Diagnosis not present

## 2016-11-30 DIAGNOSIS — R1031 Right lower quadrant pain: Secondary | ICD-10-CM | POA: Diagnosis not present

## 2017-01-04 DIAGNOSIS — R1031 Right lower quadrant pain: Secondary | ICD-10-CM | POA: Diagnosis not present

## 2017-01-04 DIAGNOSIS — E559 Vitamin D deficiency, unspecified: Secondary | ICD-10-CM | POA: Diagnosis not present

## 2017-01-04 DIAGNOSIS — M810 Age-related osteoporosis without current pathological fracture: Secondary | ICD-10-CM | POA: Diagnosis not present

## 2017-06-17 DIAGNOSIS — Z1211 Encounter for screening for malignant neoplasm of colon: Secondary | ICD-10-CM | POA: Diagnosis not present

## 2017-06-17 DIAGNOSIS — Z136 Encounter for screening for cardiovascular disorders: Secondary | ICD-10-CM | POA: Diagnosis not present

## 2017-06-17 DIAGNOSIS — Z0000 Encounter for general adult medical examination without abnormal findings: Secondary | ICD-10-CM | POA: Diagnosis not present

## 2017-08-13 DIAGNOSIS — H5203 Hypermetropia, bilateral: Secondary | ICD-10-CM | POA: Diagnosis not present

## 2017-11-17 DIAGNOSIS — Z6824 Body mass index (BMI) 24.0-24.9, adult: Secondary | ICD-10-CM | POA: Diagnosis not present

## 2017-11-17 DIAGNOSIS — Z124 Encounter for screening for malignant neoplasm of cervix: Secondary | ICD-10-CM | POA: Diagnosis not present

## 2017-11-17 DIAGNOSIS — Z01411 Encounter for gynecological examination (general) (routine) with abnormal findings: Secondary | ICD-10-CM | POA: Diagnosis not present

## 2017-11-17 DIAGNOSIS — M810 Age-related osteoporosis without current pathological fracture: Secondary | ICD-10-CM | POA: Diagnosis not present

## 2017-11-17 DIAGNOSIS — Z1231 Encounter for screening mammogram for malignant neoplasm of breast: Secondary | ICD-10-CM | POA: Diagnosis not present

## 2017-11-18 DIAGNOSIS — Z124 Encounter for screening for malignant neoplasm of cervix: Secondary | ICD-10-CM | POA: Diagnosis not present

## 2018-03-07 DIAGNOSIS — M7531 Calcific tendinitis of right shoulder: Secondary | ICD-10-CM | POA: Diagnosis not present

## 2018-03-07 DIAGNOSIS — M25511 Pain in right shoulder: Secondary | ICD-10-CM | POA: Diagnosis not present

## 2018-03-21 DIAGNOSIS — H9313 Tinnitus, bilateral: Secondary | ICD-10-CM | POA: Diagnosis not present

## 2018-09-25 ENCOUNTER — Ambulatory Visit (HOSPITAL_COMMUNITY)
Admission: EM | Admit: 2018-09-25 | Discharge: 2018-09-25 | Disposition: A | Discharge status: Home / Self Care | Payer: 59 | Attending: Family Medicine | Admitting: Family Medicine

## 2018-09-25 ENCOUNTER — Other Ambulatory Visit: Payer: Self-pay

## 2018-09-25 ENCOUNTER — Encounter (HOSPITAL_COMMUNITY): Payer: Self-pay

## 2018-09-25 DIAGNOSIS — R109 Unspecified abdominal pain: Secondary | ICD-10-CM

## 2018-09-25 LAB — POCT URINALYSIS DIP (DEVICE)
Glucose, UA: NEGATIVE mg/dL
Ketones, ur: 40 mg/dL — AB
Leukocytes,Ua: NEGATIVE
Nitrite: NEGATIVE
Protein, ur: 30 mg/dL — AB
Specific Gravity, Urine: >=1.03 (ref 1.005–1.030)
Urobilinogen, UA: 0.2 mg/dL (ref 0.0–1.0)
pH: 5.5 (ref 5.0–8.0)

## 2018-09-25 NOTE — Discharge Instructions (Addendum)
Stop taking the supplement Strontium.  It is not proven to help with osteoporosis and can have serious side effects.  Drink 8 to 10 glasses of water per day. Follow up with your primary care provider in 2-3 days for recheck of your urine.   Return here or go to the emergency department if you develop difficulty with urination, fever, chills, worsening pain, abdominal pain, vomiting, diarrhea, rash, or other concerning symptoms.
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
## 2018-09-25 NOTE — ED Triage Notes (Signed)
Pt C/O pain on the left side above her hip. Pain started yesterday.  Pt denies any falls.

## 2018-09-30 ENCOUNTER — Other Ambulatory Visit: Payer: Self-pay | Admitting: Family Medicine

## 2018-09-30 DIAGNOSIS — R3129 Other microscopic hematuria: Secondary | ICD-10-CM

## 2018-09-30 DIAGNOSIS — R109 Unspecified abdominal pain: Secondary | ICD-10-CM

## 2018-10-06 ENCOUNTER — Ambulatory Visit
Admission: RE | Admit: 2018-10-06 | Discharge: 2018-10-06 | Disposition: A | Discharge status: Home / Self Care | Payer: 59 | Source: Ambulatory Visit | Attending: Family Medicine | Admitting: Family Medicine

## 2018-10-06 DIAGNOSIS — R3129 Other microscopic hematuria: Secondary | ICD-10-CM

## 2018-10-06 DIAGNOSIS — R109 Unspecified abdominal pain: Secondary | ICD-10-CM

## 2019-09-07 ENCOUNTER — Other Ambulatory Visit: Payer: Self-pay

## 2019-09-07 ENCOUNTER — Encounter: Payer: Self-pay | Admitting: Emergency Medicine

## 2019-09-07 ENCOUNTER — Ambulatory Visit
Admission: EM | Admit: 2019-09-07 | Discharge: 2019-09-07 | Disposition: A | Discharge status: Home / Self Care | Payer: 59 | Attending: Physician Assistant | Admitting: Physician Assistant

## 2019-09-07 DIAGNOSIS — N23 Unspecified renal colic: Secondary | ICD-10-CM

## 2019-09-07 HISTORY — DX: Age-related osteoporosis without current pathological fracture: M81.0

## 2019-09-07 LAB — POCT URINALYSIS DIP (MANUAL ENTRY)
Bilirubin, UA: NEGATIVE
Glucose, UA: NEGATIVE mg/dL
Ketones, POC UA: NEGATIVE mg/dL
Leukocytes, UA: NEGATIVE
Nitrite, UA: NEGATIVE
Protein Ur, POC: NEGATIVE mg/dL
Spec Grav, UA: 1.025 (ref 1.010–1.025)
Urobilinogen, UA: 0.2 E.U./dL
pH, UA: 7 (ref 5.0–8.0)

## 2019-09-07 MED ORDER — KETOROLAC TROMETHAMINE 10 MG PO TABS: 10.0000 mg | ORAL_TABLET | Freq: Four times a day (QID) | ORAL | 0 refills | Status: AC | PRN

## 2019-09-07 MED ORDER — TAMSULOSIN HCL 0.4 MG PO CAPS: 0.4000 mg | ORAL_CAPSULE | Freq: Every day | ORAL | 0 refills | Status: AC

## 2019-09-07 MED ORDER — ONDANSETRON 4 MG PO TBDP: 4.0000 mg | ORAL_TABLET | Freq: Three times a day (TID) | ORAL | 0 refills | Status: AC | PRN

## 2019-09-07 NOTE — ED Notes (Signed)
Patient says she has had similar pain in the past and thinks she was told it was a kidney stone

## 2019-09-07 NOTE — Discharge Instructions (Signed)
Urine positive for blood. Your history and exam most consistent with passing a kidney stone. Flomax as directed. Toradol as needed for pain. Zofran as needed for nausea/vomiting. Keep hydrated, urine should be clear to pale yellow in color. If pain not controlled, please contact the office. If sudden worsening of symptoms, unable to urinate, fever, go to the emergency department for further evaluation.
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
## 2019-09-07 NOTE — ED Triage Notes (Signed)
Patient came home from work with a headache.  Patient took a nap.  Woke around 6:30 pm last night with right lower back pain.  Patient had nausea and vomiting around 8 pm.  2 episodes of vomiting, then that stopped.  Pain on right back never changed in any way.  Denies urinary symptoms

## 2019-09-08 ENCOUNTER — Ambulatory Visit
Admission: EM | Admit: 2019-09-08 | Discharge: 2019-09-08 | Disposition: A | Discharge status: Home / Self Care | Payer: 59 | Attending: Emergency Medicine | Admitting: Emergency Medicine

## 2019-09-08 DIAGNOSIS — R300 Dysuria: Secondary | ICD-10-CM

## 2019-09-08 DIAGNOSIS — N23 Unspecified renal colic: Secondary | ICD-10-CM | POA: Diagnosis not present

## 2019-09-08 LAB — POCT URINALYSIS DIP (MANUAL ENTRY)
Bilirubin, UA: NEGATIVE
Glucose, UA: NEGATIVE mg/dL
Leukocytes, UA: NEGATIVE
Nitrite, UA: NEGATIVE
Protein Ur, POC: 30 mg/dL — AB
Spec Grav, UA: 1.025 (ref 1.010–1.025)
Urobilinogen, UA: 0.2 E.U./dL
pH, UA: 5.5 (ref 5.0–8.0)

## 2019-09-08 NOTE — ED Triage Notes (Signed)
Pt states she has been experiencing right flank pain, n/v since Wednesday and was evaluated and tx here yesterday for same and given dx of kidney stones. Pt reports new onset of burning with urination, urgency, pressure, frequency today. States n/v resolved, denies fever, hematuria. Pt states that as soon as she left [facility] yesterday her flank pain improved and felt she "passed the stone" so she did not take any of the Rx given yesterday. Reports that she has been able to push fluids.

## 2019-09-08 NOTE — Discharge Instructions (Signed)
Take Toradol, Zofran, Flomax as recommended. Very important to drink plenty of fluids. Important to follow-up with urology for further evaluation. Go to ER if you develop worsening pain, dark urine, or unable to urinate, or begin vomiting, or develop a fever.
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
## 2021-03-20 IMAGING — CT CT ABDOMEN AND PELVIS WITHOUT CONTRAST
1 of 2 series · 14 of 32 positions shown, 19 images · non-contrast
Comparison: 08/08/2014

CLINICAL DATA: Left flank pain, microscopic hematuria

EXAM:
CT ABDOMEN AND PELVIS WITHOUT CONTRAST
TECHNIQUE: Multidetector CT imaging of the abdomen and pelvis was performed
following the standard protocol without IV contrast.

[Series 2: abd/pelvis w/(date) · axial · 0.83mm/px · z∈[-465,-30]mm · 14 of 97 slices shown, 19 images]
[im 5/97  soft-tissue]
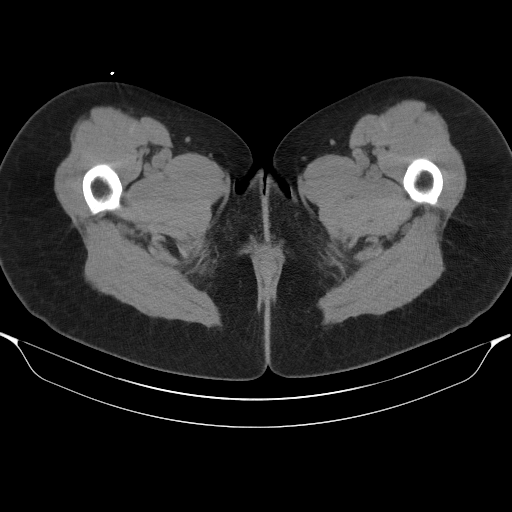
[im 5/97  bone]
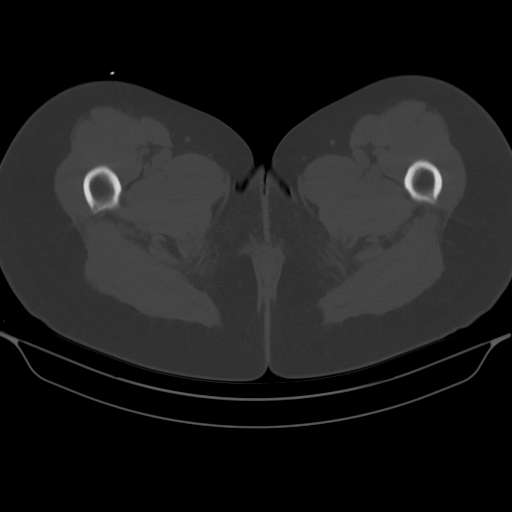
[im 15/97  soft-tissue]
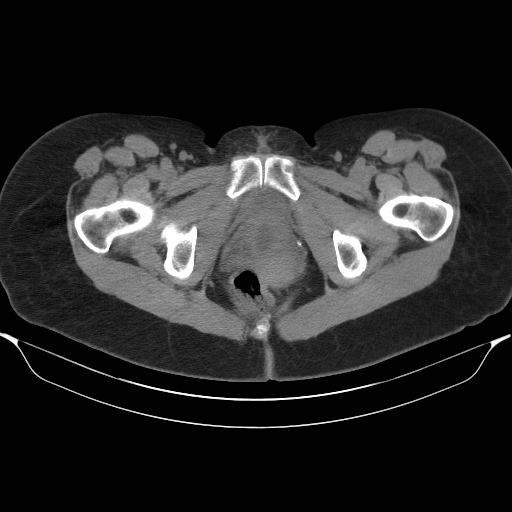
[im 20/97  soft-tissue]
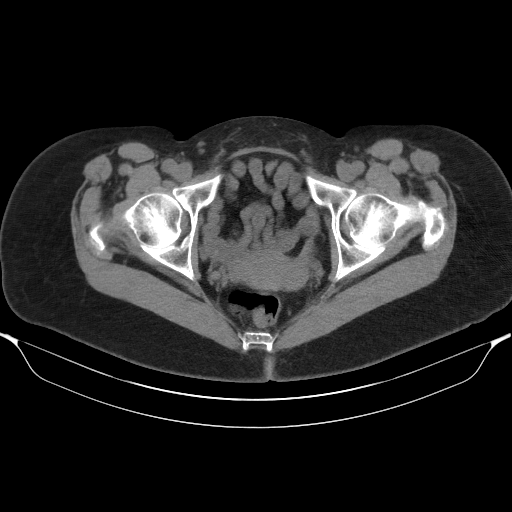
[im 29/97  soft-tissue]
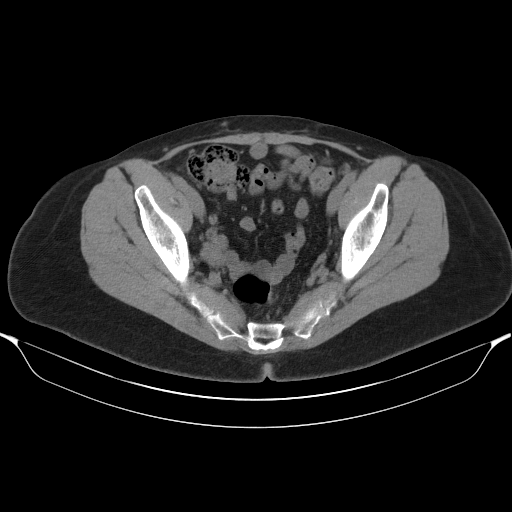
[im 34/97  soft-tissue]
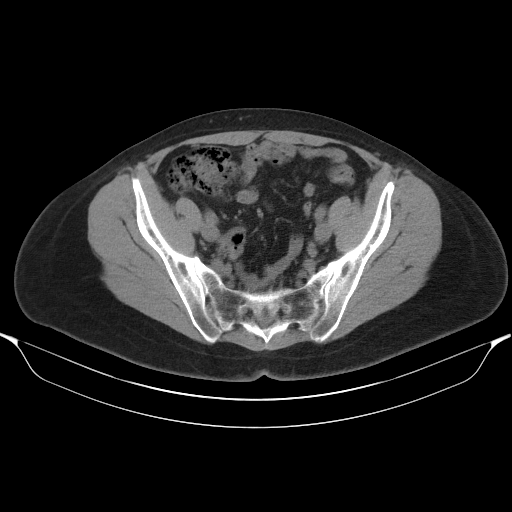
[im 44/97  soft-tissue]
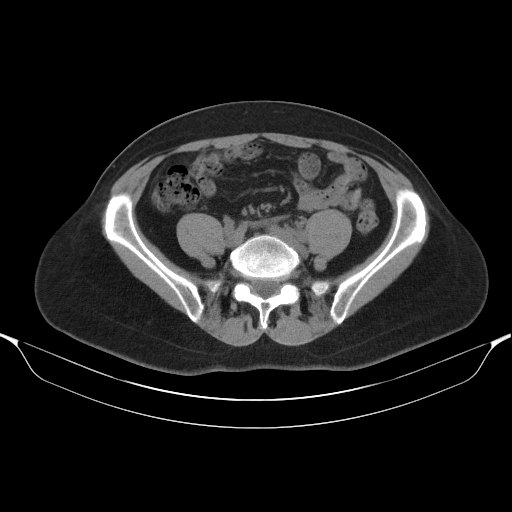
[im 49/97  soft-tissue]
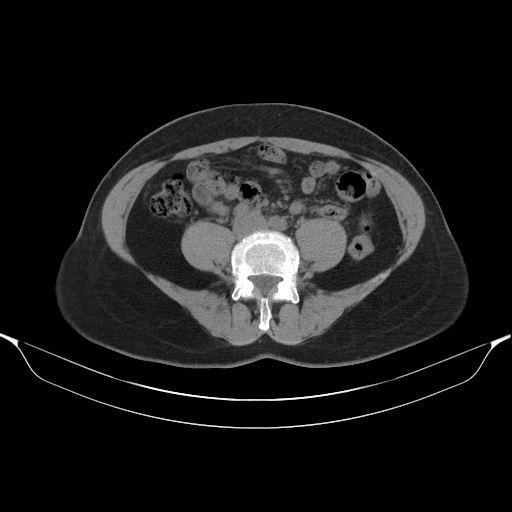
[im 53/97  soft-tissue]
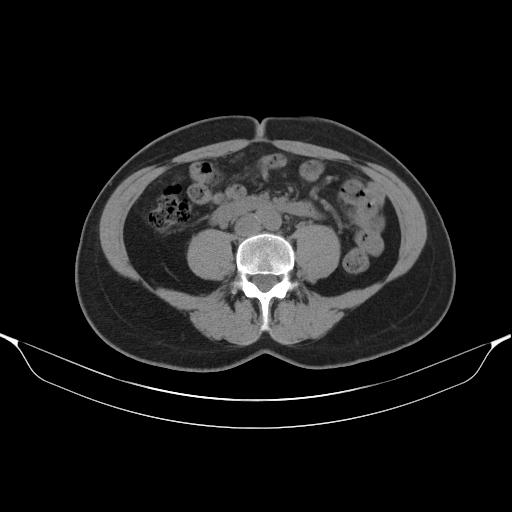
[im 63/97  soft-tissue]
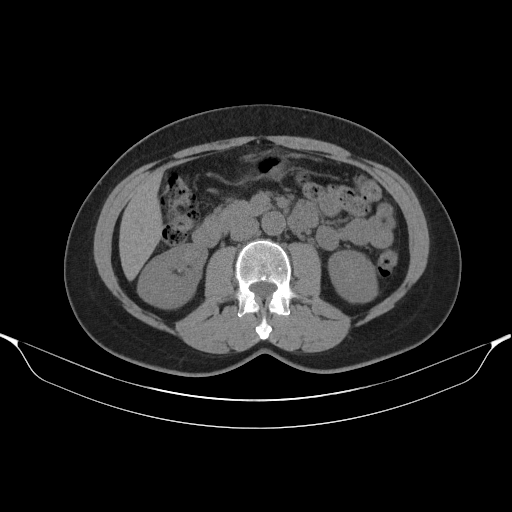
[im 63/97  bone]
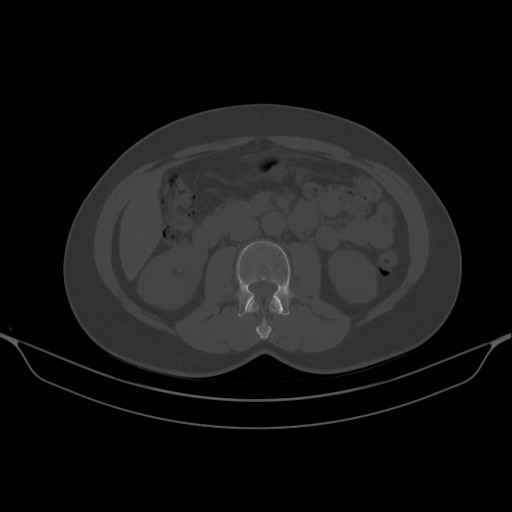
[im 68/97  soft-tissue]
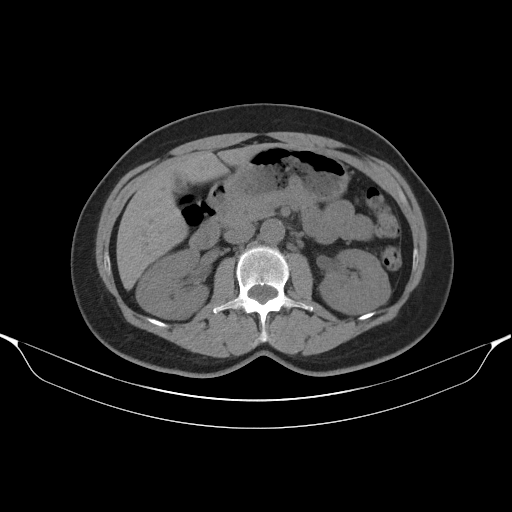
[im 77/97  soft-tissue]
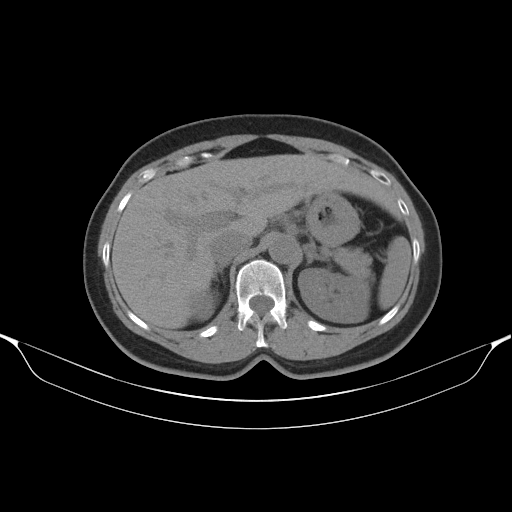
[im 77/97  lung]
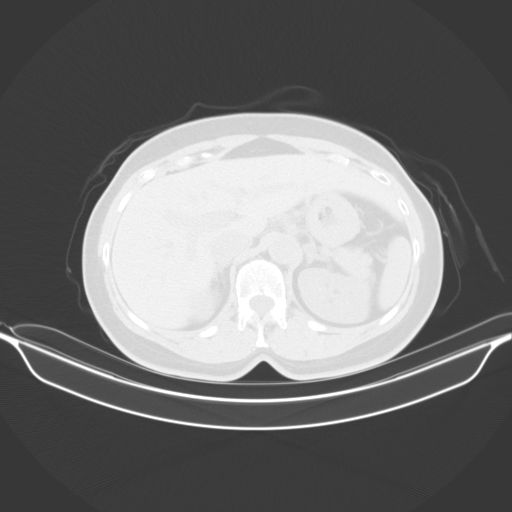
[im 82/97  soft-tissue]
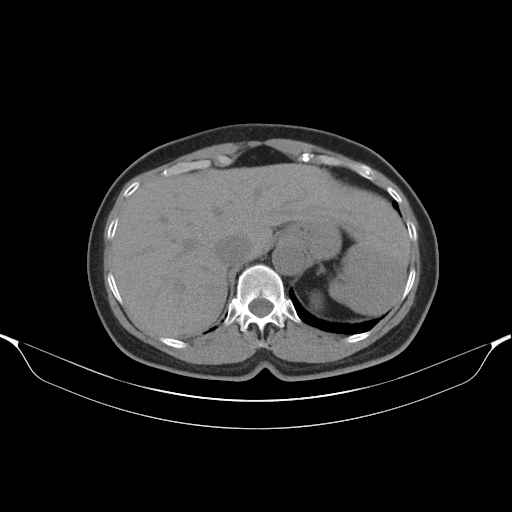
[im 82/97  lung]
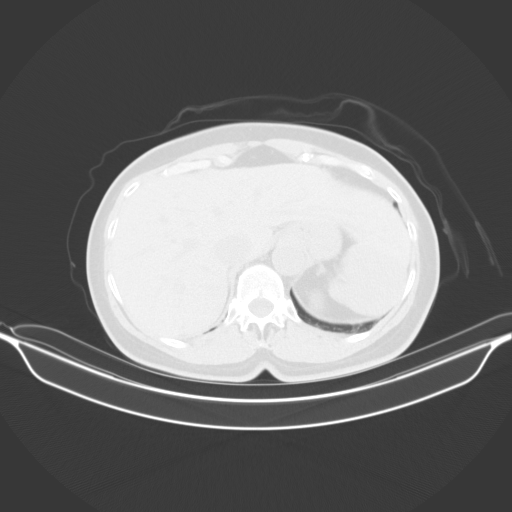
[im 87/97  lung]
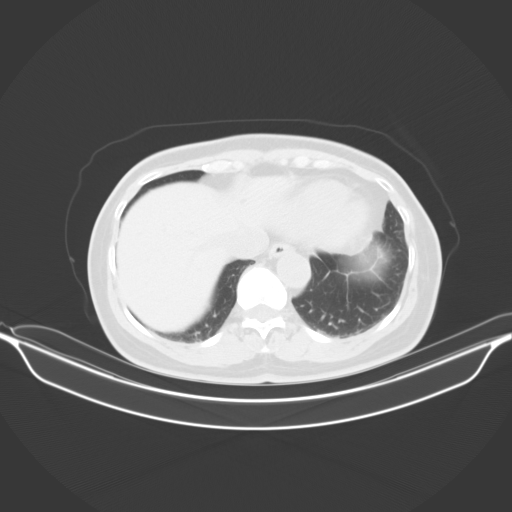
[im 92/97  soft-tissue]
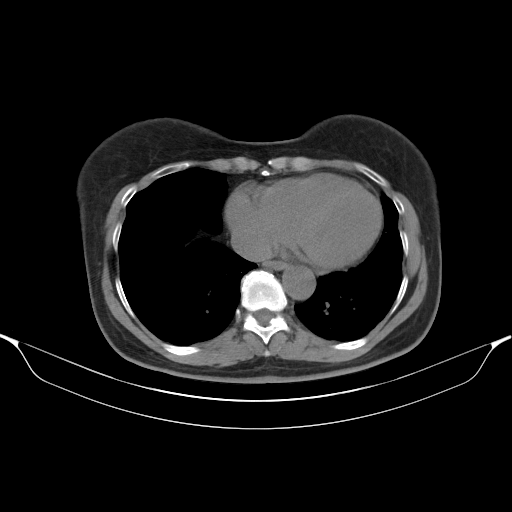
[im 92/97  lung]
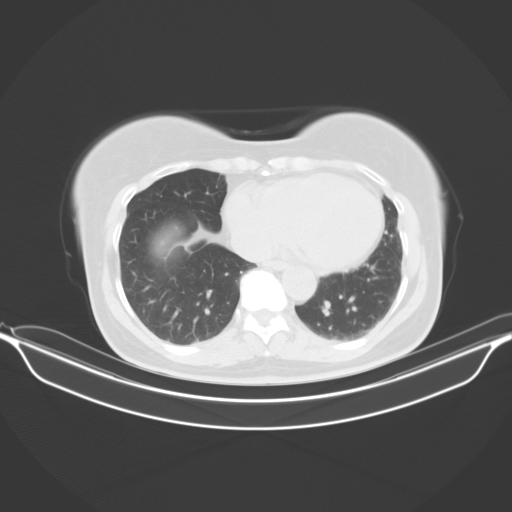

[14 of 32 positions shown; findings below may reference images not displayed]

FINDINGS: Lower chest: No acute abnormality.

Hepatobiliary: No solid liver abnormality is seen. No gallstones,
gallbladder wall thickening, or biliary dilatation.

Pancreas: Unremarkable. No pancreatic ductal dilatation or
surrounding inflammatory changes.

Spleen: Normal in size without significant abnormality.

Adrenals/Urinary Tract: Adrenal glands are unremarkable. Multiple
bilateral small nonobstructive renal calculi. No ureteral calculus
or hydronephrosis. Bladder is unremarkable.

Stomach/Bowel: Stomach is within normal limits. Appendix appears
normal. No evidence of bowel wall thickening, distention, or
inflammatory changes.

Vascular/Lymphatic: No significant vascular findings are present. No
enlarged abdominal or pelvic lymph nodes.

Reproductive: No mass or other significant abnormality.

Other: No abdominal wall hernia or abnormality. No abdominopelvic
ascites.

Musculoskeletal: No acute or significant osseous findings.
IMPRESSION: Multiple bilateral small nonobstructive renal calculi. No ureteral
calculus or hydronephrosis.

## 2021-04-18 ENCOUNTER — Ambulatory Visit
Admission: EM | Admit: 2021-04-18 | Discharge: 2021-04-18 | Disposition: A | Discharge status: Home / Self Care | Payer: 59 | Attending: Internal Medicine | Admitting: Internal Medicine

## 2021-04-18 ENCOUNTER — Other Ambulatory Visit: Payer: Self-pay

## 2021-04-18 DIAGNOSIS — J029 Acute pharyngitis, unspecified: Secondary | ICD-10-CM

## 2021-04-18 DIAGNOSIS — B349 Viral infection, unspecified: Secondary | ICD-10-CM | POA: Diagnosis present

## 2021-04-18 LAB — POCT INFLUENZA A/B
Influenza A, POC: NEGATIVE
Influenza B, POC: NEGATIVE

## 2021-04-18 LAB — POCT RAPID STREP A (OFFICE): Rapid Strep A Screen: NEGATIVE
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
## 2021-04-18 NOTE — ED Triage Notes (Signed)
Pt c/o sore throat, right "ear feels funny", body aches and chills,   Denies cough, nasal congestion, earache, headache, nausea, vomiting, diarrhea, constipation   Onset ~ yesterday

## 2021-04-18 NOTE — Discharge Instructions (Signed)
It appears that you have a viral illness that is causing your symptoms.  Rapid strep test and rapid flu test were negative.  COVID-19 PCR is pending.  Recommend symptomatic treatment over-the-counter.

## 2021-04-19 LAB — SARS-COV-2, NAA 2 DAY TAT

## 2021-04-19 LAB — NOVEL CORONAVIRUS, NAA: SARS-CoV-2, NAA: DETECTED — AB

## 2021-04-21 LAB — CULTURE, GROUP A STREP (THRC)

## 2022-01-06 ENCOUNTER — Other Ambulatory Visit: Payer: Self-pay | Admitting: Obstetrics and Gynecology

## 2022-01-06 DIAGNOSIS — M8580 Other specified disorders of bone density and structure, unspecified site: Secondary | ICD-10-CM

## 2022-02-10 ENCOUNTER — Other Ambulatory Visit: Payer: 59

## 2022-04-09 ENCOUNTER — Ambulatory Visit
Admission: RE | Admit: 2022-04-09 | Discharge: 2022-04-09 | Disposition: A | Discharge status: Home / Self Care | Payer: 59 | Source: Ambulatory Visit | Attending: Obstetrics and Gynecology | Admitting: Obstetrics and Gynecology

## 2022-04-09 DIAGNOSIS — M8580 Other specified disorders of bone density and structure, unspecified site: Secondary | ICD-10-CM

## 2023-03-29 ENCOUNTER — Other Ambulatory Visit: Payer: Self-pay | Admitting: Obstetrics and Gynecology

## 2023-03-29 DIAGNOSIS — Z1231 Encounter for screening mammogram for malignant neoplasm of breast: Secondary | ICD-10-CM

## 2023-04-20 ENCOUNTER — Ambulatory Visit
Admission: RE | Admit: 2023-04-20 | Discharge: 2023-04-20 | Disposition: A | Discharge status: Home / Self Care | Payer: 59 | Source: Ambulatory Visit | Attending: Obstetrics and Gynecology | Admitting: Obstetrics and Gynecology

## 2023-04-20 DIAGNOSIS — Z1231 Encounter for screening mammogram for malignant neoplasm of breast: Secondary | ICD-10-CM

## 2024-03-20 ENCOUNTER — Other Ambulatory Visit: Payer: Self-pay | Admitting: Obstetrics and Gynecology

## 2024-03-20 DIAGNOSIS — Z1231 Encounter for screening mammogram for malignant neoplasm of breast: Secondary | ICD-10-CM

## 2024-03-27 ENCOUNTER — Other Ambulatory Visit: Payer: Self-pay | Admitting: Obstetrics and Gynecology

## 2024-03-27 DIAGNOSIS — N644 Mastodynia: Secondary | ICD-10-CM

## 2024-04-04 ENCOUNTER — Ambulatory Visit
Admission: RE | Admit: 2024-04-04 | Discharge: 2024-04-04 | Disposition: A | Source: Ambulatory Visit | Attending: Obstetrics and Gynecology | Admitting: Obstetrics and Gynecology

## 2024-04-04 ENCOUNTER — Other Ambulatory Visit: Payer: Self-pay | Admitting: Obstetrics and Gynecology

## 2024-04-04 DIAGNOSIS — N644 Mastodynia: Secondary | ICD-10-CM

## 2024-04-04 DIAGNOSIS — N6315 Unspecified lump in the right breast, overlapping quadrants: Secondary | ICD-10-CM
